# Patient Record
Sex: Male | Born: 1960 | Race: White | Hispanic: No | Marital: Single | Smoking: Never smoker
Health system: Southern US, Community
[De-identification: ages and names within clinical notes are randomized; demographics above are authoritative.]

---

## 2005-08-25 ENCOUNTER — Ambulatory Visit: Payer: Self-pay | Admitting: Pain Medicine

## 2005-09-04 ENCOUNTER — Ambulatory Visit: Payer: Self-pay | Admitting: Pain Medicine

## 2005-09-29 ENCOUNTER — Ambulatory Visit: Payer: Self-pay | Admitting: Pain Medicine

## 2005-10-09 ENCOUNTER — Ambulatory Visit: Payer: Self-pay | Admitting: Pain Medicine

## 2005-10-27 ENCOUNTER — Ambulatory Visit: Payer: Self-pay | Admitting: Physician Assistant

## 2006-03-13 ENCOUNTER — Ambulatory Visit: Payer: Self-pay | Admitting: Dermatology

## 2010-05-05 ENCOUNTER — Inpatient Hospital Stay: Payer: Self-pay | Admitting: Surgery

## 2017-02-09 ENCOUNTER — Other Ambulatory Visit: Payer: Self-pay | Admitting: Otolaryngology

## 2017-02-09 DIAGNOSIS — H9122 Sudden idiopathic hearing loss, left ear: Secondary | ICD-10-CM

## 2017-02-13 ENCOUNTER — Ambulatory Visit: Payer: No Typology Code available for payment source

## 2017-02-18 ENCOUNTER — Ambulatory Visit: Payer: No Typology Code available for payment source

## 2019-04-19 ENCOUNTER — Other Ambulatory Visit: Payer: Self-pay | Admitting: Licensed Clinical Social Worker

## 2019-04-21 ENCOUNTER — Ambulatory Visit: Payer: No Typology Code available for payment source | Admitting: Infectious Diseases

## 2019-05-17 ENCOUNTER — Other Ambulatory Visit: Payer: Self-pay

## 2019-05-17 ENCOUNTER — Encounter: Payer: Self-pay | Admitting: Infectious Diseases

## 2019-05-17 ENCOUNTER — Ambulatory Visit: Payer: BLUE CROSS/BLUE SHIELD | Attending: Infectious Diseases | Admitting: Infectious Diseases

## 2019-05-17 VITALS — BP 162/96 | HR 64 | Temp 97.6°F | Ht 75.0 in | Wt 190.1 lb

## 2019-05-17 DIAGNOSIS — Z23 Encounter for immunization: Secondary | ICD-10-CM | POA: Diagnosis not present

## 2019-05-17 DIAGNOSIS — M545 Low back pain: Secondary | ICD-10-CM | POA: Diagnosis not present

## 2019-05-17 DIAGNOSIS — I10 Essential (primary) hypertension: Secondary | ICD-10-CM | POA: Diagnosis not present

## 2019-05-17 DIAGNOSIS — H9192 Unspecified hearing loss, left ear: Secondary | ICD-10-CM

## 2019-05-17 DIAGNOSIS — E785 Hyperlipidemia, unspecified: Secondary | ICD-10-CM | POA: Diagnosis not present

## 2019-05-17 DIAGNOSIS — Z885 Allergy status to narcotic agent status: Secondary | ICD-10-CM

## 2019-05-17 DIAGNOSIS — Z21 Asymptomatic human immunodeficiency virus [HIV] infection status: Secondary | ICD-10-CM

## 2019-05-17 DIAGNOSIS — K219 Gastro-esophageal reflux disease without esophagitis: Secondary | ICD-10-CM | POA: Insufficient documentation

## 2019-05-17 DIAGNOSIS — B20 Human immunodeficiency virus [HIV] disease: Secondary | ICD-10-CM

## 2019-05-17 DIAGNOSIS — Z79899 Other long term (current) drug therapy: Secondary | ICD-10-CM

## 2019-05-17 DIAGNOSIS — G8929 Other chronic pain: Secondary | ICD-10-CM

## 2019-05-17 MED ORDER — BICTEGRAVIR-EMTRICITAB-TENOFOV 50-200-25 MG PO TABS: 1.0000 | ORAL_TABLET | Freq: Every day | ORAL | 6 refills | Status: DC

## 2019-05-17 NOTE — Patient Instructions (Signed)
You are ehre to engage in HIV care- you are on Biktarvy and you have not taken it for 6 days as you ran out of meds I called your pharmacy and prescritiopn have been sent Your BP today is 140/94 - would recommend monitoring it at [facility] ( pharmacy) and maintaining a log Today we will check the following labs- VL, Cd4, CMP, CBC, RPR, Lipid, quant god, Hepatitis panel You will get Prevnar ( Pneumococcal conjugate vaccine today) You have been on omeprazole for 2 years for GERD - it can cause osteoporosis, b12 def , risk for bacterial overgrowth- you may want to make diet modifications, do not eat late, avoid NSAID like ibuprofen and start to taper off the omeprazole. I you get symptoms of GERD you may want to see GI doctor  LBP- chronic- recommend exercise,  Stretching   Low Back Sprain or Strain Rehab Ask your health care provider which exercises are safe for you. Do exercises exactly as told by your health care provider and adjust them as directed. It is normal to feel mild stretching, pulling, tightness, or discomfort as you do these exercises. Stop right away if you feel sudden pain or your pain gets worse. Do not begin these exercises until told by your health care provider. Stretching and range-of-motion exercises These exercises warm up your muscles and joints and improve the movement and flexibility of your back. These exercises also help to relieve pain, numbness, and tingling. Lumbar rotation  1. Lie on your back on a firm surface and bend your knees. 2. Straighten your arms out to your sides so each arm forms a 90-degree angle (right angle) with a side of your body. 3. Slowly move (rotate) both of your knees to one side of your body until you feel a stretch in your lower back (lumbar). Try not to let your shoulders lift off the floor. 4. Hold this position for __________ seconds. 5. Tense your abdominal muscles and slowly move your knees back to the starting position. 6. Repeat this  exercise on the other side of your body. Repeat __________ times. Complete this exercise __________ times a day. Single knee to chest  1. Lie on your back on a firm surface with both legs straight. 2. Bend one of your knees. Use your hands to move your knee up toward your chest until you feel a gentle stretch in your lower back and buttock. ? Hold your leg in this position by holding on to the front of your knee. ? Keep your other leg as straight as possible. 3. Hold this position for __________ seconds. 4. Slowly return to the starting position. 5. Repeat with your other leg. Repeat __________ times. Complete this exercise __________ times a day. Prone extension on elbows  1. Lie on your abdomen on a firm surface (prone position). 2. Prop yourself up on your elbows. 3. Use your arms to help lift your chest up until you feel a gentle stretch in your abdomen and your lower back. ? This will place some of your body weight on your elbows. If this is uncomfortable, try stacking pillows under your chest. ? Your hips should stay down, against the surface that you are lying on. Keep your hip and back muscles relaxed. 4. Hold this position for __________ seconds. 5. Slowly relax your upper body and return to the starting position. Repeat __________ times. Complete this exercise __________ times a day. Strengthening exercises These exercises build strength and endurance in your back. Endurance is  the ability to use your muscles for a long time, even after they get tired. Pelvic tilt This exercise strengthens the muscles that lie deep in the abdomen. 1. Lie on your back on a firm surface. Bend your knees and keep your feet flat on the floor. 2. Tense your abdominal muscles. Tip your pelvis up toward the ceiling and flatten your lower back into the floor. ? To help with this exercise, you may place a small towel under your lower back and try to push your back into the towel. 3. Hold this position  for __________ seconds. 4. Let your muscles relax completely before you repeat this exercise. Repeat __________ times. Complete this exercise __________ times a day. Alternating arm and leg raises  1. Get on your hands and knees on a firm surface. If you are on a hard floor, you may want to use padding, such as an exercise mat, to cushion your knees. 2. Line up your arms and legs. Your hands should be directly below your shoulders, and your knees should be directly below your hips. 3. Lift your left leg behind you. At the same time, raise your right arm and straighten it in front of you. ? Do not lift your leg higher than your hip. ? Do not lift your arm higher than your shoulder. ? Keep your abdominal and back muscles tight. ? Keep your hips facing the ground. ? Do not arch your back. ? Keep your balance carefully, and do not hold your breath. 4. Hold this position for __________ seconds. 5. Slowly return to the starting position. 6. Repeat with your right leg and your left arm. Repeat __________ times. Complete this exercise __________ times a day. Abdominal set with straight leg raise  1. Lie on your back on a firm surface. 2. Bend one of your knees and keep your other leg straight. 3. Tense your abdominal muscles and lift your straight leg up, 4-6 inches (10-15 cm) off the ground. 4. Keep your abdominal muscles tight and hold this position for __________ seconds. ? Do not hold your breath. ? Do not arch your back. Keep it flat against the ground. 5. Keep your abdominal muscles tense as you slowly lower your leg back to the starting position. 6. Repeat with your other leg. Repeat __________ times. Complete this exercise __________ times a day. Single leg lower with bent knees 1. Lie on your back on a firm surface. 2. Tense your abdominal muscles and lift your feet off the floor, one foot at a time, so your knees and hips are bent in 90-degree angles (right angles). ? Your knees  should be over your hips and your lower legs should be parallel to the floor. 3. Keeping your abdominal muscles tense and your knee bent, slowly lower one of your legs so your toe touches the ground. 4. Lift your leg back up to return to the starting position. ? Do not hold your breath. ? Do not let your back arch. Keep your back flat against the ground. 5. Repeat with your other leg. Repeat __________ times. Complete this exercise __________ times a day. Posture and body mechanics Good posture and healthy body mechanics can help to relieve stress in your body's tissues and joints. Body mechanics refers to the movements and positions of your body while you do your daily activities. Posture is part of body mechanics. Good posture means:  Your spine is in its natural S-curve position (neutral).  Your shoulders are pulled back slightly.  Your head is not tipped forward. Follow these guidelines to improve your posture and body mechanics in your everyday activities. Standing   When standing, keep your spine neutral and your feet about hip width apart. Keep a slight bend in your knees. Your ears, shoulders, and hips should line up.  When you do a task in which you stand in one place for a long time, place one foot up on a stable object that is 2-4 inches (5-10 cm) high, such as a footstool. This helps keep your spine neutral. Sitting   When sitting, keep your spine neutral and keep your feet flat on the floor. Use a footrest, if necessary, and keep your thighs parallel to the floor. Avoid rounding your shoulders, and avoid tilting your head forward.  When working at a desk or a computer, keep your desk at a height where your hands are slightly lower than your elbows. Slide your chair under your desk so you are close enough to maintain good posture.  When working at a computer, place your monitor at a height where you are looking straight ahead and you do not have to tilt your head forward or  downward to look at the screen. Resting  When lying down and resting, avoid positions that are most painful for you.  If you have pain with activities such as sitting, bending, stooping, or squatting, lie in a position in which your body does not bend very much. For example, avoid curling up on your side with your arms and knees near your chest (fetal position).  If you have pain with activities such as standing for a long time or reaching with your arms, lie with your spine in a neutral position and bend your knees slightly. Try the following positions: ? Lying on your side with a pillow between your knees. ? Lying on your back with a pillow under your knees. Lifting   When lifting objects, keep your feet at least shoulder width apart and tighten your abdominal muscles.  Bend your knees and hips and keep your spine neutral. It is important to lift using the strength of your legs, not your back. Do not lock your knees straight out.  Always ask for help to lift heavy or awkward objects. This information is not intended to replace advice given to you by your health care provider. Make sure you discuss any questions you have with your health care provider. Document Released: 10/27/2005 Document Revised: 02/18/2019 Document Reviewed: 11/18/2018 Elsevier Patient Education  [employer].    Gastroesophageal Reflux Disease, Adult Gastroesophageal reflux (GER) happens when acid from the stomach flows up into the tube that connects the mouth and the stomach (esophagus). Normally, food travels down the esophagus and stays in the stomach to be digested. With GER, food and stomach acid sometimes move back up into the esophagus. You may have a disease called gastroesophageal reflux disease (GERD) if the reflux:  Happens often.  Causes frequent or very bad symptoms.  Causes problems such as damage to the esophagus. When this happens, the esophagus becomes sore and swollen (inflamed). Over  time, GERD can make small holes (ulcers) in the lining of the esophagus. What are the causes? This condition is caused by a problem with the muscle between the esophagus and the stomach. When this muscle is weak or not normal, it does not close properly to keep food and acid from coming back up from the stomach. The muscle can be weak because of:  Tobacco  use.  Pregnancy.  Having a certain type of hernia (hiatal hernia).  Alcohol use.  Certain foods and drinks, such as coffee, chocolate, onions, and peppermint. What increases the risk? You are more likely to develop this condition if you:  Are overweight.  Have a disease that affects your connective tissue.  Use NSAID medicines. What are the signs or symptoms? Symptoms of this condition include:  Heartburn.  Difficult or painful swallowing.  The feeling of having a lump in the throat.  A bitter taste in the mouth.  Bad breath.  Having a lot of saliva.  Having an upset or bloated stomach.  Belching.  Chest pain. Different conditions can cause chest pain. Make sure you see your doctor if you have chest pain.  Shortness of breath or noisy breathing (wheezing).  Ongoing (chronic) cough or a cough at night.  Wearing away of the surface of teeth (tooth enamel).  Weight loss. How is this treated? Treatment will depend on how bad your symptoms are. Your doctor may suggest:  Changes to your diet.  Medicine.  Surgery. Follow these instructions at home: Eating and drinking   Follow a diet as told by your doctor. You may need to avoid foods and drinks such as: ? Coffee and tea (with or without caffeine). ? Drinks that contain alcohol. ? Energy drinks and sports drinks. ? Bubbly (carbonated) drinks or sodas. ? Chocolate and cocoa. ? Peppermint and mint flavorings. ? Garlic and onions. ? Horseradish. ? Spicy and acidic foods. These include peppers, chili powder, curry powder, vinegar, hot sauces, and BBQ sauce.  ? Citrus fruit juices and citrus fruits, such as oranges, lemons, and limes. ? Tomato-based foods. These include red sauce, chili, salsa, and pizza with red sauce. ? Fried and fatty foods. These include donuts, french fries, potato chips, and high-fat dressings. ? High-fat meats. These include hot dogs, rib eye steak, sausage, ham, and bacon. ? High-fat dairy items, such as whole milk, butter, and cream cheese.  Eat small meals often. Avoid eating large meals.  Avoid drinking large amounts of liquid with your meals.  Avoid eating meals during the 2-3 hours before bedtime.  Avoid lying down right after you eat.  Do not exercise right after you eat. Lifestyle   Do not use any products that contain nicotine or tobacco. These include cigarettes, e-cigarettes, and chewing tobacco. If you need help quitting, ask your doctor.  Try to lower your stress. If you need help doing this, ask your doctor.  If you are overweight, lose an amount of weight that is healthy for you. Ask your doctor about a safe weight loss goal. General instructions  Pay attention to any changes in your symptoms.  Take over-the-counter and prescription medicines only as told by your doctor. Do not take aspirin, ibuprofen, or other NSAIDs unless your doctor says it is okay.  Wear loose clothes. Do not wear anything tight around your waist.  Raise (elevate) the head of your bed about 6 inches (15 cm).  Avoid bending over if this makes your symptoms worse.  Keep all follow-up visits as told by your doctor. This is important. Contact a doctor if:  You have new symptoms.  You lose weight and you do not know why.  You have trouble swallowing or it hurts to swallow.  You have wheezing or a cough that keeps happening.  Your symptoms do not get better with treatment.  You have a hoarse voice. Get help right away if:  You  have pain in your arms, neck, jaw, teeth, or back.  You feel sweaty, dizzy, or  light-headed.  You have chest pain or shortness of breath.  You throw up (vomit) and your throw-up looks like blood or coffee grounds.  You pass out (faint).  Your poop (stool) is bloody or black.  You cannot swallow, drink, or eat. Summary  If a person has gastroesophageal reflux disease (GERD), food and stomach acid move back up into the esophagus and cause symptoms or problems such as damage to the esophagus.  Treatment will depend on how bad your symptoms are.  Follow a diet as told by your doctor.  Take all medicines only as told by your doctor. This information is not intended to replace advice given to you by your health care provider. Make sure you discuss any questions you have with your health care provider. Document Released: 04/14/2008 Document Revised: 05/05/2018 Document Reviewed: 05/05/2018 Elsevier Patient Education  2020 [employer]

## 2019-05-17 NOTE — Progress Notes (Signed)
NAME: George RancherSteven Casey  DOB: 1961-01-24  MRN: [id number]  Date/Time: 05/17/2019 9:55 AM   Subjective:  REASON FOR CONSULT: Here to engage in HIV care- his previous provider was Dr.Fitzgerald  ? George Casey is a 58 y.o. male with a history of HIV , currently on biktarvy and has been adherent to it until 6 days ago when his 1 year refill ran out and his pharmacy asked him to get a new prescription- As his previous provider not in practice he is here to engage  in care. 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 Oxycodone Rash   ? Current Outpatient Medications  Medication Sig Dispense Refill  . albuterol (VENTOLIN HFA) 108 (90 Base) MCG/ACT inhaler Inhale 2 puffs into the lungs every 6 (six) hours.    Marland Kitchen. atorvastatin (LIPITOR) 20 MG tablet Take 1 tablet by mouth daily.    . bictegravir-emtricitabine-tenofovir AF (BIKTARVY) 50-200-25 MG TABS tablet Take 1 tablet by mouth daily.    Marland Kitchen. desoximetasone (TOPICORT) 0.25 % cream Apply 1 application topically 2 (two) times a day.    Marland Kitchen. omeprazole (PRILOSEC) 40 MG capsule  Take 1 capsule by mouth 2 (two) times a day.     No current facility-administered medications for this visit.     REVIEW OF SYSTEMS:  Const: negative fever, negative chills, negative weight loss Eyes: negative diplopia or visual changes, negative eye pain ENT: negative coryza, negative sore throat Resp: negative cough, hemoptysis, dyspnea Cards: negative for chest pain, palpitations, lower extremity edema GU: negative for frequency, dysuria and hematuria GI- loose stool - once or twice a week Skin: negative for rash and pruritus Heme: negative for easy bruising and gum/nose bleeding MS: back pain- chronic Neurolo:+ headaches, dizziness, vertigo, memory problems  Psych: negative for feelings of anxiety, depression   Objective:  VITALS:  BP (!) 162/96 (BP Location: Right Arm, Patient Position: Sitting, Cuff Size: Normal)   Pulse 64   Temp 97.6 F (36.4 C) (Oral)   Ht 6\' 3"  (1.905 m)   Wt 190 lb 2 oz (86.2 kg)   BMI 23.76 kg/m  PHYSICAL EXAM: repeat BP 140/94 General: Alert, cooperative, no distress, appears stated age.  Head: Normocephalic, without obvious abnormality, atraumatic. Eyes: Conjunctivae clear, anicteric sclerae. Pupils are equal Left hearing aid Nose: Nares normal. No drainage or sinus tenderness. Throat: Lips, mucosa, and tongue normal. No Thrush Dentition poor Neck: Supple, symmetrical, no adenopathy, thyroid: non tender no carotid bruit and no JVD. Back: No CVA tenderness. Lungs: Clear to auscultation bilaterally. No Wheezing or Rhonchi. No rales. Heart:  Regular rate and rhythm, no murmur, rub or gallop. Abdomen: Soft, non-tender,not distended. Bowel sounds normal. No masses Extremities: Extremities normal, atraumatic, no cyanosis. No edema. No clubbing Skin: No rashes or lesions. Not Jaundiced Lymph: Cervical, supraclavicular normal. Neurologic: Grossly non-focal Pertinent Labs  Health maintenance Vaccination  Vaccine Date last given comment   Influenza    Hepatitis B    Hepatitis A    Prevnar-PCV-13 05/17/19   Pneumovac-PPSV-23 08/08/10   TdaP 08/26/12   HPV    Shingrix ( zoster vaccine)     ______________________  Labs Lab Result  Date comment  HIV VL <20    CD4 562 (21.6%) 04/19/18   Genotype     [id number]     HIV antibody     RPR     Quantiferon Gold     Hep C ab     Hepatitis B-ab,ag,c     Hepatitis A-IgM, IgG /T     Lipid 259/120/65/ 170 04/16/18 On atorvasttain  GC/CHL     PAP     HB,PLT,Cr, LFT       Preventive  Procedure Result  Date comment  colonoscopy     Mammogram     Dental exam 1 year ago    Opthal       Impression/Recommendation 58 yr male with h/o HIV, hyperlipidemia  HIV; well controlled on HAARt- combination of integrase inhibitor+ FTC+ TAF = Biktarvy?- last Vl < 20 and cd4 > 500 in June 2019. Pt has been off [place] for 6 days as he could not get a refill .   Hyperlipidemia on atorvastatin  High BP- says he never had before- rechecked and it was 140/94- asked him to monitor his BP and if high to report to me.  GERD- has been on PPI for 2 years- Discussed about possible adverse effects of PPI including osteoporosis, B 12 def, overgrowth of bacteria in the intestine and renal issues Asked him to taper it off-  Also asked him to avoid NSAID  Chronic low back pain- advised strengthening exercises and stretches. H/oleft sciatica which has resolved  Health maintenance updated ANAL Pap needed ( GC/CHL)  Will get labs today  Prevnar ( PCV 13) given today  Follow up with BP mnitoring ? Follow up 6 months for HIV  ___________________________________________________ Discussed the management with the patient

## 2019-06-08 ENCOUNTER — Inpatient Hospital Stay: Admit: 2019-06-08 | Payer: BLUE CROSS/BLUE SHIELD

## 2019-06-08 ENCOUNTER — Other Ambulatory Visit: Payer: Self-pay

## 2019-06-10 ENCOUNTER — Other Ambulatory Visit: Payer: Self-pay | Admitting: Infectious Diseases

## 2019-06-17 LAB — T-HELPER CELLS CD4/CD8 %
% CD 4 Pos. Lymph.: 25.6 % — ABNORMAL LOW (ref 30.8–58.5)
Absolute CD 4 Helper: 512 /uL (ref 359–1519)
Basophils Absolute: 0.1 10*3/uL (ref 0.0–0.2)
Basos: 2 %
CD3+CD4+ Cells/CD3+CD8+ Cells Bld: 0.45 — ABNORMAL LOW (ref 0.92–3.72)
CD3+CD8+ Cells # Bld: 1134 /uL — ABNORMAL HIGH (ref 109–897)
CD3+CD8+ Cells NFr Bld: 56.7 % — ABNORMAL HIGH (ref 12.0–35.5)
EOS (ABSOLUTE): 0.2 10*3/uL (ref 0.0–0.4)
Eos: 3 %
Hematocrit: 41.7 % (ref 37.5–51.0)
Hemoglobin: 15.2 g/dL (ref 13.0–17.7)
Immature Grans (Abs): 0 10*3/uL (ref 0.0–0.1)
Immature Granulocytes: 0 %
Lymphocytes Absolute: 2 10*3/uL (ref 0.7–3.1)
Lymphs: 31 %
MCH: 33.9 pg — ABNORMAL HIGH (ref 26.6–33.0)
MCHC: 36.5 g/dL — ABNORMAL HIGH (ref 31.5–35.7)
MCV: 93 fL (ref 79–97)
Monocytes Absolute: 0.6 10*3/uL (ref 0.1–0.9)
Monocytes: 10 %
Neutrophils Absolute: 3.5 10*3/uL (ref 1.4–7.0)
Neutrophils: 54 %
Platelets: 180 10*3/uL (ref 150–450)
RBC: 4.49 x10E6/uL (ref 4.14–5.80)
RDW: 12.5 % (ref 11.6–15.4)
WBC: 6.5 10*3/uL (ref 3.4–10.8)

## 2019-06-17 LAB — COMPREHENSIVE METABOLIC PANEL
ALT: 24 IU/L (ref 0–44)
AST: 20 IU/L (ref 0–40)
Albumin/Globulin Ratio: 2.3 — ABNORMAL HIGH (ref 1.2–2.2)
Albumin: 4.8 g/dL (ref 3.8–4.9)
Alkaline Phosphatase: 66 IU/L (ref 39–117)
BUN/Creatinine Ratio: 16 (ref 9–20)
BUN: 15 mg/dL (ref 6–24)
Bilirubin Total: 0.9 mg/dL (ref 0.0–1.2)
CO2: 26 mmol/L (ref 20–29)
Calcium: 9.5 mg/dL (ref 8.7–10.2)
Chloride: 100 mmol/L (ref 96–106)
Creatinine, Ser: 0.94 mg/dL (ref 0.76–1.27)
GFR calc Af Amer: 103 mL/min/{1.73_m2} (ref >59)
GFR calc non Af Amer: 89 mL/min/{1.73_m2} (ref >59)
Globulin, Total: 2.1 g/dL (ref 1.5–4.5)
Glucose: 90 mg/dL (ref 65–99)
Potassium: 4.3 mmol/L (ref 3.5–5.2)
Sodium: 142 mmol/L (ref 134–144)
Total Protein: 6.9 g/dL (ref 6.0–8.5)

## 2019-06-17 LAB — LIPID PANEL
Chol/HDL Ratio: 3.9 ratio (ref 0.0–5.0)
Cholesterol, Total: 236 mg/dL — ABNORMAL HIGH (ref 100–199)
HDL: 61 mg/dL (ref >39)
LDL Calculated: 139 mg/dL — ABNORMAL HIGH (ref 0–99)
Triglycerides: 180 mg/dL — ABNORMAL HIGH (ref 0–149)
VLDL Cholesterol Cal: 36 mg/dL (ref 5–40)

## 2019-06-17 LAB — HIV-1 RNA QUANT-NO REFLEX-BLD: HIV-1 RNA Viral Load: <20 copies/mL

## 2019-06-17 LAB — GC/CHLAMYDIA PROBE AMP
Chlamydia trachomatis, NAA: NEGATIVE
Neisseria Gonorrhoeae by PCR: NEGATIVE

## 2019-06-17 LAB — QUANTIFERON-TB GOLD PLUS
QuantiFERON Mitogen Value: 8.5 IU/mL
QuantiFERON Nil Value: 0.01 IU/mL
QuantiFERON TB1 Ag Value: 0.02 IU/mL
QuantiFERON TB2 Ag Value: 0.02 IU/mL
QuantiFERON-TB Gold Plus: NEGATIVE

## 2019-06-17 LAB — HEPATITIS PANEL, ACUTE
Hep A IgM: NEGATIVE
Hep B C IgM: NEGATIVE
Hep C Virus Ab: <0.1 s/co ratio (ref 0.0–0.9)
Hepatitis B Surface Ag: NEGATIVE

## 2019-06-17 LAB — HEMOGLOBIN A1C
Est. average glucose Bld gHb Est-mCnc: 103 mg/dL
Hgb A1c MFr Bld: 5.2 % (ref 4.8–5.6)

## 2019-06-17 LAB — HEPATITIS B SURFACE ANTIBODY, QUANTITATIVE: Hepatitis B Surf Ab Quant: <3.1 m[IU]/mL — ABNORMAL LOW (ref >9.9)

## 2019-06-17 LAB — RPR: RPR Ser Ql: NONREACTIVE

## 2019-09-13 ENCOUNTER — Other Ambulatory Visit: Payer: Self-pay | Admitting: *Deleted

## 2019-09-13 DIAGNOSIS — Z20822 Contact with and (suspected) exposure to covid-19: Secondary | ICD-10-CM

## 2019-09-14 LAB — NOVEL CORONAVIRUS, NAA: SARS-CoV-2, NAA: NOT DETECTED

## 2019-11-17 ENCOUNTER — Ambulatory Visit: Payer: BLUE CROSS/BLUE SHIELD | Admitting: Infectious Diseases

## 2019-11-24 ENCOUNTER — Telehealth: Payer: Self-pay

## 2019-11-24 NOTE — Telephone Encounter (Signed)
Left message regarding Prior Auth and next appt.

## 2019-12-27 ENCOUNTER — Other Ambulatory Visit: Payer: Self-pay | Admitting: Infectious Diseases

## 2019-12-27 MED ORDER — BICTEGRAVIR-EMTRICITAB-TENOFOV 50-200-25 MG PO TABS: 1.0000 | ORAL_TABLET | Freq: Every day | ORAL | 6 refills | Status: AC

## 2020-03-23 LAB — EXTERNAL GENERIC LAB PROCEDURE: COLOGUARD: NEGATIVE

## 2020-03-23 LAB — COLOGUARD: COLOGUARD: NEGATIVE

## 2021-06-04 ENCOUNTER — Other Ambulatory Visit: Payer: Self-pay | Admitting: Orthopedic Surgery

## 2021-06-04 ENCOUNTER — Other Ambulatory Visit (HOSPITAL_COMMUNITY): Payer: Self-pay | Admitting: Orthopedic Surgery

## 2021-06-04 DIAGNOSIS — M19011 Primary osteoarthritis, right shoulder: Secondary | ICD-10-CM

## 2021-06-04 DIAGNOSIS — M75101 Unspecified rotator cuff tear or rupture of right shoulder, not specified as traumatic: Secondary | ICD-10-CM

## 2021-06-07 ENCOUNTER — Ambulatory Visit: Admission: RE | Admit: 2021-06-07 | Payer: 59 | Source: Ambulatory Visit

## 2021-06-14 ENCOUNTER — Ambulatory Visit: Admission: RE | Admit: 2021-06-14 | Payer: 59 | Source: Ambulatory Visit

## 2021-06-27 ENCOUNTER — Other Ambulatory Visit: Payer: Self-pay | Admitting: Infectious Diseases

## 2021-06-27 DIAGNOSIS — M545 Low back pain, unspecified: Secondary | ICD-10-CM

## 2021-07-06 ENCOUNTER — Ambulatory Visit: Payer: 59

## 2021-07-20 ENCOUNTER — Ambulatory Visit
Admission: RE | Admit: 2021-07-20 | Discharge: 2021-07-20 | Disposition: A | Discharge status: Home / Self Care | Payer: 59 | Source: Ambulatory Visit | Attending: Orthopedic Surgery | Admitting: Orthopedic Surgery

## 2021-07-20 ENCOUNTER — Ambulatory Visit
Admission: RE | Admit: 2021-07-20 | Discharge: 2021-07-20 | Disposition: A | Discharge status: Home / Self Care | Payer: 59 | Source: Ambulatory Visit | Attending: Infectious Diseases | Admitting: Infectious Diseases

## 2021-07-20 ENCOUNTER — Other Ambulatory Visit: Payer: Self-pay

## 2021-07-20 DIAGNOSIS — M75101 Unspecified rotator cuff tear or rupture of right shoulder, not specified as traumatic: Secondary | ICD-10-CM | POA: Insufficient documentation

## 2021-07-20 DIAGNOSIS — M19011 Primary osteoarthritis, right shoulder: Secondary | ICD-10-CM

## 2021-07-20 DIAGNOSIS — M545 Low back pain, unspecified: Secondary | ICD-10-CM | POA: Diagnosis not present

## 2021-07-20 DIAGNOSIS — G8929 Other chronic pain: Secondary | ICD-10-CM | POA: Insufficient documentation

## 2021-07-20 MED ORDER — GADOBUTROL 1 MMOL/ML IV SOLN
8.0000 mL | Freq: Once | INTRAVENOUS | Status: AC | PRN
  Administered 2021-07-20: 10 mL via INTRAVENOUS

## 2021-10-20 ENCOUNTER — Emergency Department
Admission: EM | Admit: 2021-10-20 | Discharge: 2021-10-20 | Disposition: A | Discharge status: Home / Self Care | Payer: 59 | Attending: Emergency Medicine | Admitting: Emergency Medicine

## 2021-10-20 ENCOUNTER — Other Ambulatory Visit: Payer: Self-pay

## 2021-10-20 ENCOUNTER — Encounter: Payer: Self-pay | Admitting: Emergency Medicine

## 2021-10-20 DIAGNOSIS — Z21 Asymptomatic human immunodeficiency virus [HIV] infection status: Secondary | ICD-10-CM | POA: Diagnosis not present

## 2021-10-20 DIAGNOSIS — K0889 Other specified disorders of teeth and supporting structures: Secondary | ICD-10-CM | POA: Insufficient documentation

## 2021-10-20 MED ORDER — AMOXICILLIN 875 MG PO TABS: 875.0000 mg | ORAL_TABLET | Freq: Two times a day (BID) | ORAL | 0 refills | Status: AC

## 2021-10-20 MED ORDER — LIDOCAINE VISCOUS HCL 2 % MT SOLN
15.0000 mL | Freq: Once | OROMUCOSAL | Status: AC
  Administered 2021-10-20: 15 mL via OROMUCOSAL

## 2021-10-20 MED ORDER — AMOXICILLIN 875 MG PO TABS: 875.0000 mg | ORAL_TABLET | Freq: Two times a day (BID) | ORAL | 0 refills | Status: DC

## 2021-10-20 MED ORDER — ACETAMINOPHEN-CODEINE 300-30 MG PO TABS: 1.0000 | ORAL_TABLET | Freq: Four times a day (QID) | ORAL | 0 refills | Status: AC | PRN

## 2021-10-20 MED ORDER — ACETAMINOPHEN-CODEINE 300-30 MG PO TABS: 1.0000 | ORAL_TABLET | Freq: Four times a day (QID) | ORAL | 0 refills | Status: DC | PRN

## 2021-10-20 NOTE — ED Triage Notes (Signed)
Pt reports toothache to right lower jaw back tooth for several days. Denies trauma, states thinks it may be infected.

## 2021-10-20 NOTE — ED Provider Notes (Signed)
[facility] Emergency Department Provider Note  ____________________________________________   Event Date/Time   First MD Initiated Contact with Patient 10/20/21 [phone]     (approximate)  I have reviewed the triage vital signs and the nursing notes.   HISTORY  Chief Complaint Dental Pain   HPI George Casey is a 60 y.o. male presents to the ED with complaint of right lower dental pain for the last 4 days.  Patient is unaware of any injury to his tooth.  He states that the pain has kept him up for the last 2 nights.  Currently he does have a local dentist and plans on calling them tomorrow.  He rates pain as a 10/10.       History reviewed. No pertinent past medical history.  Patient Active Problem List   Diagnosis Date Noted   HIV disease (HCC) 05/17/2019   Hyperlipidemia 05/17/2019   GERD (gastroesophageal reflux disease) 05/17/2019    History reviewed. No pertinent surgical history.  Prior to Admission medications   Medication Sig Start Date End Date Taking? Authorizing Provider  Acetaminophen-Codeine (TYLENOL/CODEINE #3) 300-30 MG tablet Take 1 tablet by mouth every 6 (six) hours as needed for pain. 10/20/21  Yes Tommi Rumps, PA-C  amoxicillin (AMOXIL) 875 MG tablet Take 1 tablet (875 mg total) by mouth 2 (two) times daily. 10/20/21  Yes Bridget Hartshorn L, PA-C  albuterol (VENTOLIN HFA) 108 (90 Base) MCG/ACT inhaler Inhale 2 puffs into the lungs every 6 (six) hours. 11/13/16   [provider]  atorvastatin (LIPITOR) 20 MG tablet Take 1 tablet by mouth daily. 04/26/18   [provider]  bictegravir-emtricitabine-tenofovir AF (BIKTARVY) 50-200-25 MG TABS tablet Take 1 tablet by mouth daily. 12/27/19   Lynn Ito, MD  desoximetasone (TOPICORT) 0.25 % cream Apply 1 application topically 2 (two) times a day. 11/13/16   [provider]  omeprazole (PRILOSEC) 40 MG capsule Take 1 capsule by mouth 2 (two) times a day.  07/21/18   [provider]    Allergies Oxycodone  History reviewed. No pertinent family history.  Social History Social History   Tobacco Use   Smoking status: Never   Smokeless tobacco: Never  Substance Use Topics   Alcohol use: Yes    Alcohol/week: 2.0 standard drinks    Types: 1 Cans of beer, 1 Glasses of wine per week   Drug use: Never    Review of Systems Constitutional: No fever/chills Eyes: No visual changes. ENT: No sore throat.  Positive for dental pain. Cardiovascular: Denies chest pain. Respiratory: Denies shortness of breath. Musculoskeletal: Negative for muscle aches. Skin: Negative for rash. Neurological: Negative for focal weakness or numbness. Immunological:  + HIV  ____________________________________________   PHYSICAL EXAM:  VITAL SIGNS: ED Triage Vitals  Enc Vitals Group     BP 10/20/21 0746 (!) 172/97     Pulse Rate 10/20/21 0746 73     Resp 10/20/21 0746 18     Temp 10/20/21 0746 97.9 F (36.6 C)     Temp Source 10/20/21 0746 Oral     SpO2 10/20/21 0746 96 %     Weight 10/20/21 0722 185 lb (83.9 kg)     Height 10/20/21 0722 6\' 3"  (1.905 m)     Head Circumference --      Peak Flow --      Pain Score 10/20/21 0722 10     Pain Loc --      Pain Edu? --  Excl. in GC? --     Constitutional: Alert and oriented. Well appearing and in no acute distress. Eyes: Conjunctivae are normal.  Head: Atraumatic. Nose: No congestion/rhinnorhea. Mouth/Throat: Mucous membranes are moist.  Oropharynx non-erythematous.  Right lower posterior molarWith extremely tender gums to light palpation.  No obvious exudate or drainage is present. Neck: No stridor.   Hematological/Lymphatic/Immunilogical: No cervical lymphadenopathy. Cardiovascular: Normal rate, regular rhythm. Grossly normal heart sounds.  Good peripheral circulation. Respiratory: Normal respiratory effort.  No retractions. Lungs CTAB. Musculoskeletal: Moves upper and lower  extremities without any difficulty.  Normal gait was noted. Neurologic:  Normal speech and language. No gross focal neurologic deficits are appreciated. No gait instability. Skin:  Skin is warm, dry and intact.  Psychiatric: Mood and affect are normal. Speech and behavior are normal.  ____________________________________________   LABS (all labs ordered are listed, but only abnormal results are displayed)  Labs Reviewed - No data to display ____________________________________________   PROCEDURES  Procedure(s) performed (including Critical Care):  Procedures   ____________________________________________   INITIAL IMPRESSION / ASSESSMENT AND PLAN / ED COURSE  As part of my medical decision making, I reviewed the following data within the electronic MEDICAL RECORD NUMBER Notes from prior ED visits and  Controlled Substance Database  60 year old male presents to the ED with complaint of 4 days dental pain.Patient has taken over-the-counter medication without any relief.  He is established with a local dentist and plans to call tomorrow.A prescription for amoxicillin 875 twice daily was sent to his pharmacy along with acetaminophen-codeine 1 tablet every 6 hours as needed pain #8 no refill. ____________________________________________   FINAL CLINICAL IMPRESSION(S) / ED DIAGNOSES  Final diagnoses:  Pain, dental     ED Discharge Orders          Ordered    amoxicillin (AMOXIL) 875 MG tablet  2 times daily        10/20/21 0821    Acetaminophen-Codeine (TYLENOL/CODEINE #3) 300-30 MG tablet  Every 6 hours PRN        10/20/21 2202             Note:  This document was prepared using Dragon voice recognition software and may include unintentional dictation errors.    Tommi Rumps, PA-C 10/20/21 5427    Arnaldo Natal, MD 10/20/21 [phone]

## 2021-10-20 NOTE — Discharge Instructions (Addendum)
Call your dentist tomorrow and begin taking antibiotics today.  Amoxicillin 875 twice daily for the next 10 days.  The Tylenol with codeine is a narcotic and should not be taken while driving or operating machinery.  Soft foods and chew mostly on the opposite side.

## 2021-10-20 NOTE — ED Notes (Signed)
Reviewed discharge instructions, follow-up care, and prescriptions with patient. Patient verbalized understanding of all information reviewed. Patient stable, with no distress noted at this time.    

## 2022-01-27 ENCOUNTER — Other Ambulatory Visit (HOSPITAL_COMMUNITY): Payer: Self-pay

## 2022-01-27 ENCOUNTER — Telehealth: Payer: Self-pay

## 2022-01-27 DIAGNOSIS — Z21 Asymptomatic human immunodeficiency virus [HIV] infection status: Secondary | ICD-10-CM | POA: Diagnosis not present

## 2022-01-27 DIAGNOSIS — L0390 Cellulitis, unspecified: Secondary | ICD-10-CM | POA: Diagnosis not present

## 2022-01-27 DIAGNOSIS — R600 Localized edema: Secondary | ICD-10-CM | POA: Diagnosis not present

## 2022-01-27 DIAGNOSIS — E7800 Pure hypercholesterolemia, unspecified: Secondary | ICD-10-CM | POA: Diagnosis not present

## 2022-01-27 NOTE — Telephone Encounter (Signed)
[facility] Patient Advocate Encounter ?  ?I was successful in securing patient a $7500.00 grant from [employer] ([employer]) to provide copayment coverage for [employer].  This will make the out of pocket cost $0.00.   ?  ?I have spoken with the patient.   ? ?The billing information is as follows and has been shared with [facility].  ? ? ? ? ? ?Dates of Eligibility: 01/27/22 through 01/28/23 ? ?Patient knows to call the office with questions or concerns. ? ?Clearance Coots, CPhT ?Specialty Pharmacy Patient Advocate ?[facility] ?Phone: [phone] ?Fax:  [phone]  ?

## 2022-07-03 DIAGNOSIS — E7800 Pure hypercholesterolemia, unspecified: Secondary | ICD-10-CM | POA: Diagnosis not present

## 2022-07-03 DIAGNOSIS — L0390 Cellulitis, unspecified: Secondary | ICD-10-CM | POA: Diagnosis not present

## 2022-07-03 DIAGNOSIS — R600 Localized edema: Secondary | ICD-10-CM | POA: Diagnosis not present

## 2022-07-03 DIAGNOSIS — Z113 Encounter for screening for infections with a predominantly sexual mode of transmission: Secondary | ICD-10-CM | POA: Diagnosis not present

## 2022-07-03 DIAGNOSIS — Z21 Asymptomatic human immunodeficiency virus [HIV] infection status: Secondary | ICD-10-CM | POA: Diagnosis not present

## 2023-04-01 DIAGNOSIS — I10 Essential (primary) hypertension: Secondary | ICD-10-CM | POA: Diagnosis not present

## 2023-04-01 DIAGNOSIS — Z0000 Encounter for general adult medical examination without abnormal findings: Secondary | ICD-10-CM | POA: Diagnosis not present

## 2023-04-01 DIAGNOSIS — Z21 Asymptomatic human immunodeficiency virus [HIV] infection status: Secondary | ICD-10-CM | POA: Diagnosis not present

## 2023-04-01 DIAGNOSIS — E7800 Pure hypercholesterolemia, unspecified: Secondary | ICD-10-CM | POA: Diagnosis not present

## 2023-04-01 DIAGNOSIS — N529 Male erectile dysfunction, unspecified: Secondary | ICD-10-CM | POA: Diagnosis not present

## 2023-04-01 DIAGNOSIS — K219 Gastro-esophageal reflux disease without esophagitis: Secondary | ICD-10-CM | POA: Diagnosis not present

## 2023-04-01 DIAGNOSIS — Z125 Encounter for screening for malignant neoplasm of prostate: Secondary | ICD-10-CM | POA: Diagnosis not present

## 2023-04-01 DIAGNOSIS — R600 Localized edema: Secondary | ICD-10-CM | POA: Diagnosis not present

## 2023-06-04 DIAGNOSIS — Z1212 Encounter for screening for malignant neoplasm of rectum: Secondary | ICD-10-CM | POA: Diagnosis not present

## 2023-06-04 DIAGNOSIS — Z1211 Encounter for screening for malignant neoplasm of colon: Secondary | ICD-10-CM | POA: Diagnosis not present

## 2023-06-09 LAB — COLOGUARD: COLOGUARD: NEGATIVE

## 2023-06-09 LAB — EXTERNAL GENERIC LAB PROCEDURE: COLOGUARD: NEGATIVE

## 2023-09-02 DIAGNOSIS — N529 Male erectile dysfunction, unspecified: Secondary | ICD-10-CM | POA: Diagnosis not present

## 2023-09-02 DIAGNOSIS — Z21 Asymptomatic human immunodeficiency virus [HIV] infection status: Secondary | ICD-10-CM | POA: Diagnosis not present

## 2023-09-02 DIAGNOSIS — J45909 Unspecified asthma, uncomplicated: Secondary | ICD-10-CM | POA: Diagnosis not present

## 2023-09-02 DIAGNOSIS — Z809 Family history of malignant neoplasm, unspecified: Secondary | ICD-10-CM | POA: Diagnosis not present

## 2023-09-02 DIAGNOSIS — Z79899 Other long term (current) drug therapy: Secondary | ICD-10-CM | POA: Diagnosis not present

## 2023-09-02 DIAGNOSIS — I10 Essential (primary) hypertension: Secondary | ICD-10-CM | POA: Diagnosis not present

## 2023-09-02 DIAGNOSIS — E785 Hyperlipidemia, unspecified: Secondary | ICD-10-CM | POA: Diagnosis not present

## 2023-09-02 DIAGNOSIS — K219 Gastro-esophageal reflux disease without esophagitis: Secondary | ICD-10-CM | POA: Diagnosis not present

## 2023-09-02 DIAGNOSIS — Z8249 Family history of ischemic heart disease and other diseases of the circulatory system: Secondary | ICD-10-CM | POA: Diagnosis not present

## 2023-09-02 DIAGNOSIS — Z823 Family history of stroke: Secondary | ICD-10-CM | POA: Diagnosis not present

## 2023-09-10 IMAGING — MR MR SHOULDER*R* W/O CM
4 of 5 series · 30 of 40 positions shown · non-contrast
Comparison: Report only from outside radiographs 03/13/2021

CLINICAL DATA: Right shoulder pain and limited range of motion.
History of weight lifting. Reported surgery in 1882.

EXAM:
MRI OF THE RIGHT SHOULDER WITHOUT CONTRAST
TECHNIQUE: Multiplanar, multisequence MR imaging of the shoulder was performed.
No intravenous contrast was administered.

[Series 5: T2 fat-sat · axial · right · 4.0mm · 0.44mm/px · z∈[-15,+119]mm · 8 of 30 slices shown (1 of 3)]
[im 1/30]
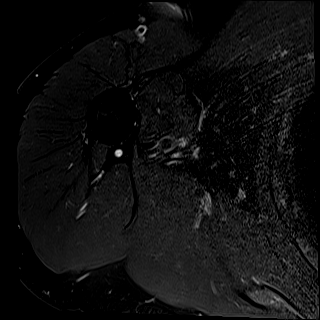
[im 4/30]
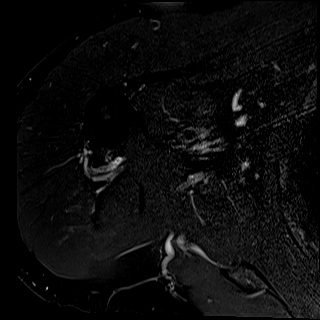
[im 10/30]
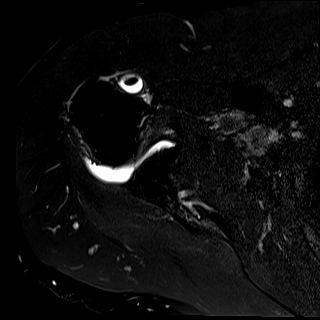
[im 13/30]
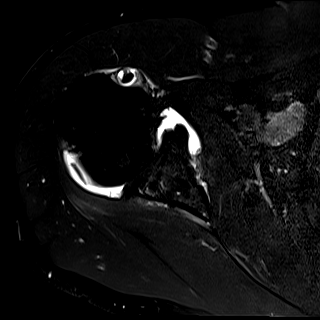
[im 17/30]
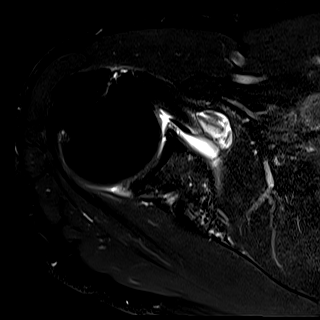
[im 20/30]
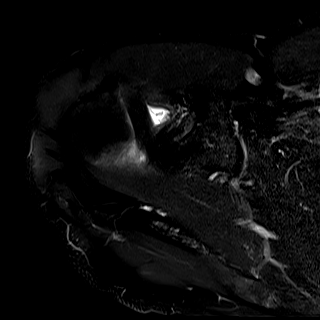
[im 26/30]
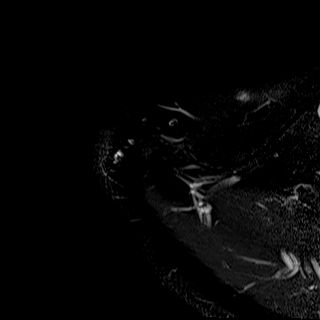
[im 30/30]
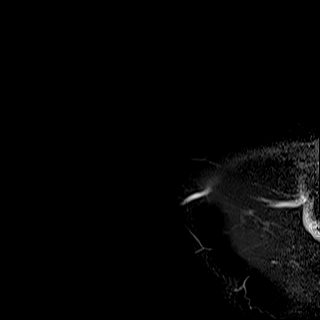

[Series 6: PD · oblique · right · 4.0mm · 0.44mm/px · 8 of 26 slices shown]
[im 1/26]
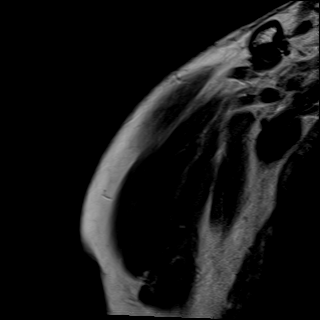
[im 4/26]
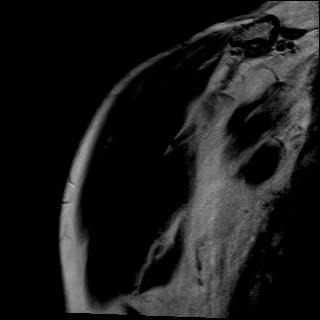
[im 8/26]
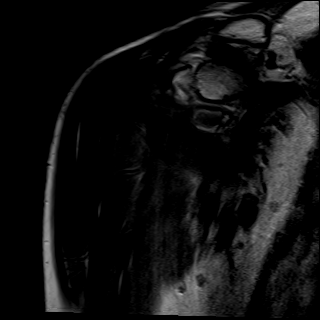
[im 11/26]
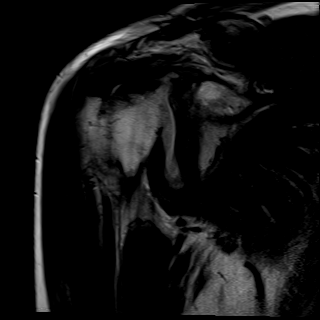
[im 15/26]
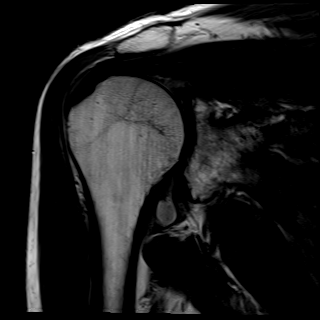
[im 18/26]
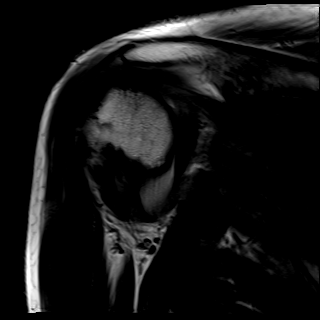
[im 22/26]
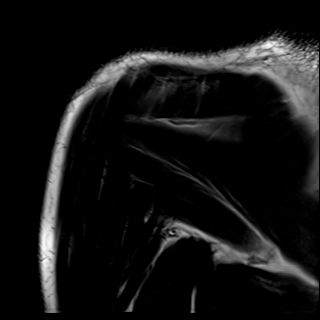
[im 26/26]
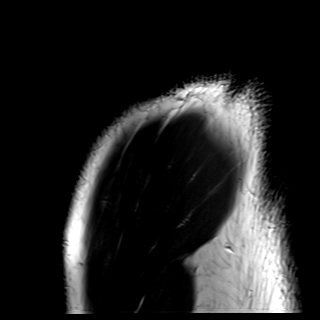

[Series 7: T2 fat-sat · oblique · right · 4.0mm · 0.44mm/px · 8 of 26 slices shown (2 of 3)]
[im 1/26]
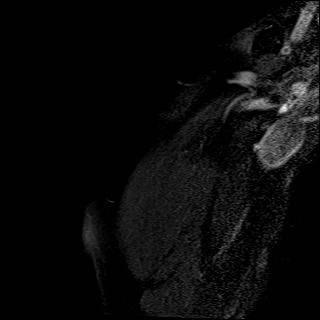
[im 4/26]
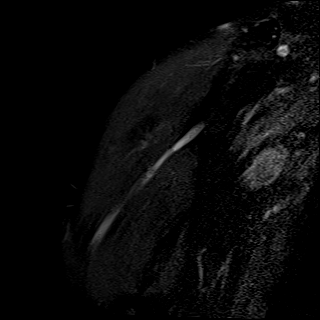
[im 8/26]
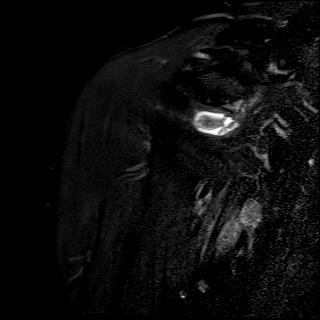
[im 11/26]
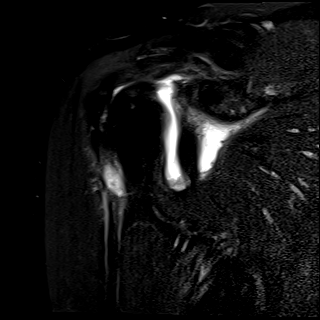
[im 15/26]
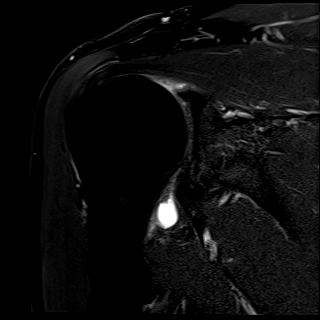
[im 18/26]
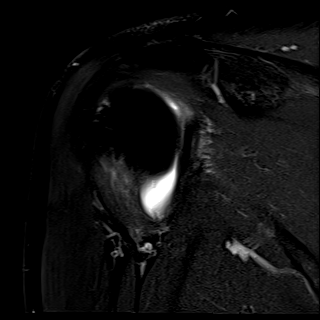
[im 22/26]
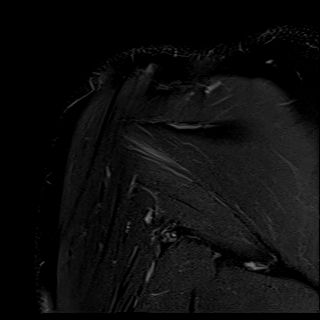
[im 26/26]
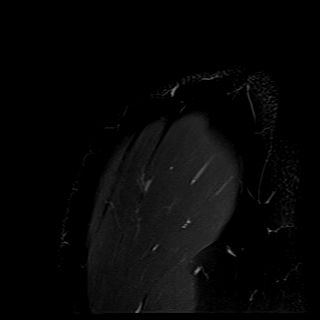

[Series 8: T2 fat-sat · oblique · right · 4.0mm · 0.22mm/px · 6 of 22 slices shown (3 of 3)]
[im 1/22]
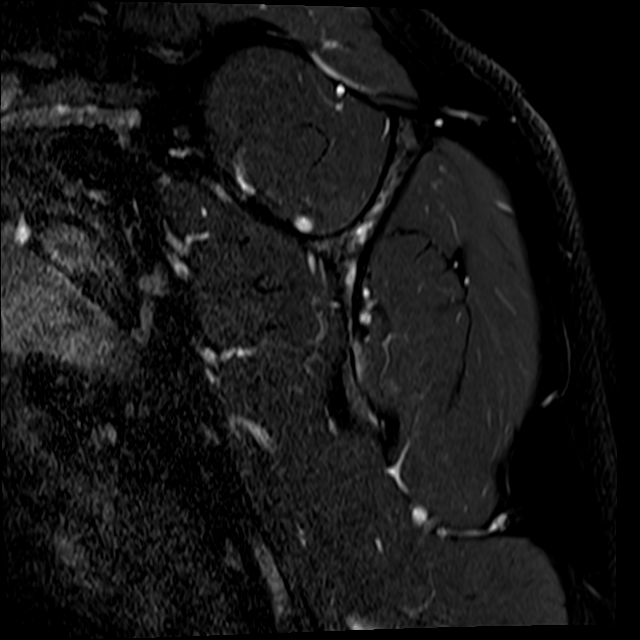
[im 4/22]
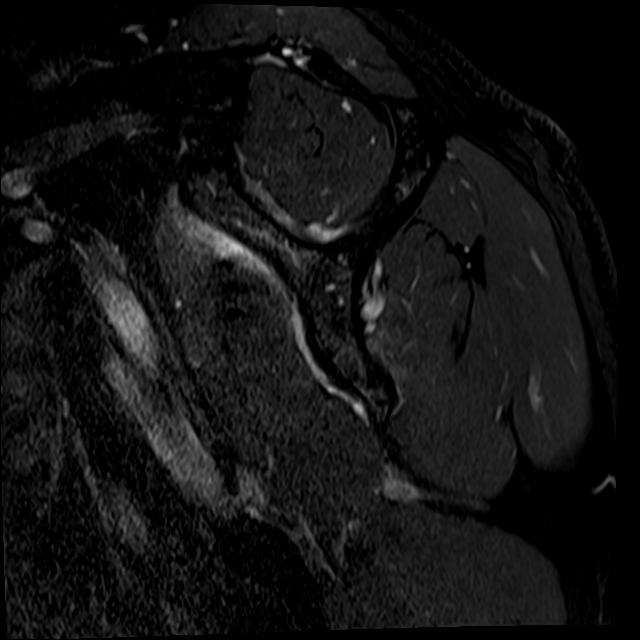
[im 8/22]
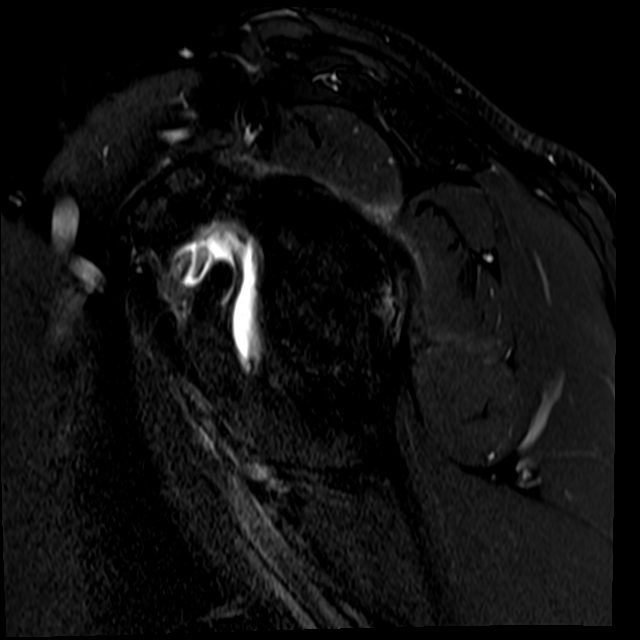
[im 11/22]
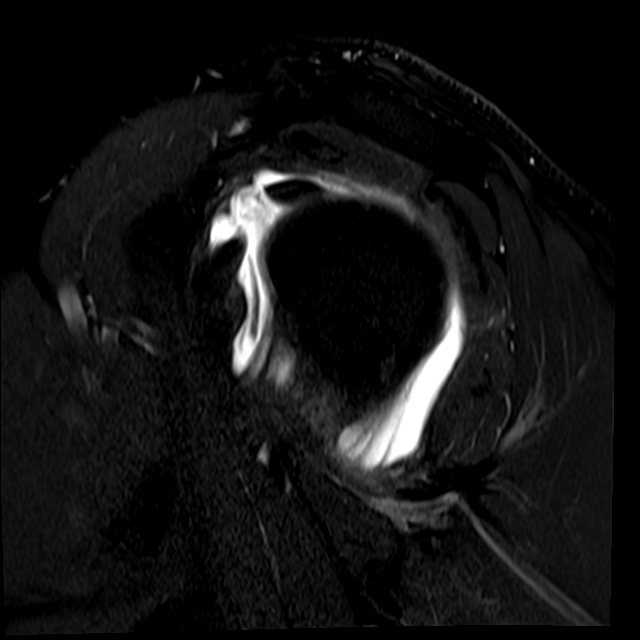
[im 15/22]
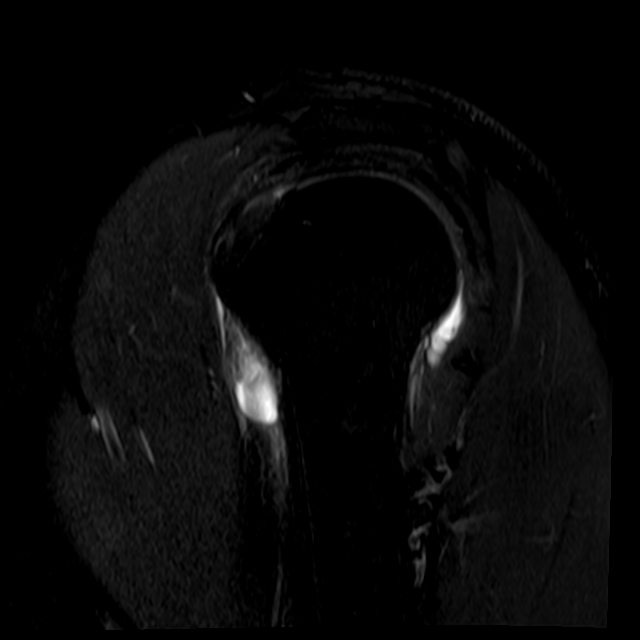
[im 18/22]
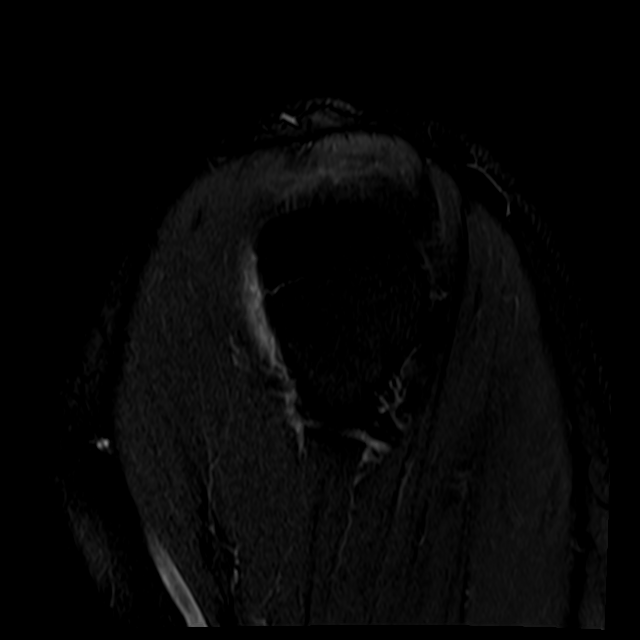

[30 of 40 positions shown; findings below may reference images not displayed]

FINDINGS: Rotator cuff:  Intact without significant tendinosis.

Muscles:  No focal muscular atrophy or edema.

Biceps long head:  Intact and normally positioned.

Acromioclavicular Joint: Evidence of previous distal clavicle
resection. No significant fluid in the subacromial-subdeltoid space.

Glenohumeral Joint: Moderate glenohumeral degenerative changes with
prominent subchondral cyst formation posteriorly in the glenoid.
Moderate size shoulder joint effusion with synovial thickening in
the superior subscapularis recess.

Labrum: Posterior labral degeneration without discrete tear or
paralabral cyst.

Bones: No acute or significant extra-articular osseous findings.Mild
subcortical cyst formation posteriorly in the humeral head near the
infraspinatus insertion.

Other: No significant soft tissue findings.
IMPRESSION: 1. Postsurgical changes consistent with previous distal clavicle
resection.
2. The rotator cuff appears intact without significant tendinosis.
The biceps tendon appears intact.
3. Moderate glenohumeral degenerative changes with posterior labral
degeneration.
4. No acute osseous findings evident.

## 2024-01-11 ENCOUNTER — Telehealth: Payer: Self-pay

## 2024-01-11 ENCOUNTER — Other Ambulatory Visit (HOSPITAL_COMMUNITY): Payer: Self-pay

## 2024-01-11 NOTE — Telephone Encounter (Signed)
 [facility] Patient Advocate Encounter   Was successful in obtaining a [place] copay card for [employer].  This copay card will make the patients copay 0.00.  I have spoken with the patient.    The billing information is as follows and has been shared with [facility].  #[phone]         Clearance Coots, CPhT Specialty Pharmacy Patient Advocate [facility] Phone: [phone] Fax:  [phone]

## 2024-04-08 DIAGNOSIS — Z0000 Encounter for general adult medical examination without abnormal findings: Secondary | ICD-10-CM | POA: Diagnosis not present

## 2024-04-08 DIAGNOSIS — E7800 Pure hypercholesterolemia, unspecified: Secondary | ICD-10-CM | POA: Diagnosis not present

## 2024-04-08 DIAGNOSIS — I10 Essential (primary) hypertension: Secondary | ICD-10-CM | POA: Diagnosis not present

## 2024-04-11 DIAGNOSIS — Z1331 Encounter for screening for depression: Secondary | ICD-10-CM | POA: Diagnosis not present

## 2024-04-11 DIAGNOSIS — K219 Gastro-esophageal reflux disease without esophagitis: Secondary | ICD-10-CM | POA: Diagnosis not present

## 2024-04-11 DIAGNOSIS — Z21 Asymptomatic human immunodeficiency virus [HIV] infection status: Secondary | ICD-10-CM | POA: Diagnosis not present

## 2024-04-11 DIAGNOSIS — E7800 Pure hypercholesterolemia, unspecified: Secondary | ICD-10-CM | POA: Diagnosis not present

## 2024-04-11 DIAGNOSIS — I10 Essential (primary) hypertension: Secondary | ICD-10-CM | POA: Diagnosis not present

## 2024-07-16 ENCOUNTER — Other Ambulatory Visit: Payer: Self-pay

## 2024-07-16 ENCOUNTER — Emergency Department

## 2024-07-16 ENCOUNTER — Emergency Department
Admission: EM | Admit: 2024-07-16 | Discharge: 2024-07-16 | Disposition: A | Discharge status: Home / Self Care | Source: Ambulatory Visit | Attending: Emergency Medicine | Admitting: Emergency Medicine

## 2024-07-16 DIAGNOSIS — K529 Noninfective gastroenteritis and colitis, unspecified: Secondary | ICD-10-CM | POA: Diagnosis not present

## 2024-07-16 DIAGNOSIS — A039 Shigellosis, unspecified: Secondary | ICD-10-CM | POA: Diagnosis not present

## 2024-07-16 DIAGNOSIS — R1030 Lower abdominal pain, unspecified: Secondary | ICD-10-CM | POA: Diagnosis not present

## 2024-07-16 DIAGNOSIS — A09 Infectious gastroenteritis and colitis, unspecified: Secondary | ICD-10-CM | POA: Diagnosis not present

## 2024-07-16 DIAGNOSIS — R1010 Upper abdominal pain, unspecified: Secondary | ICD-10-CM | POA: Diagnosis present

## 2024-07-16 LAB — GASTROINTESTINAL PANEL BY PCR, STOOL (REPLACES STOOL CULTURE)

## 2024-07-16 LAB — URINALYSIS, ROUTINE W REFLEX MICROSCOPIC
Bilirubin Urine: NEGATIVE
Glucose, UA: NEGATIVE mg/dL
Hgb urine dipstick: NEGATIVE
Ketones, ur: NEGATIVE mg/dL
Leukocytes,Ua: NEGATIVE
Nitrite: NEGATIVE
Protein, ur: NEGATIVE mg/dL
Specific Gravity, Urine: 1.014 (ref 1.005–1.030)
pH: 6 (ref 5.0–8.0)

## 2024-07-16 LAB — COMPREHENSIVE METABOLIC PANEL WITH GFR
ALT: 20 U/L (ref 0–44)
AST: 20 U/L (ref 15–41)
Albumin: 3 g/dL — ABNORMAL LOW (ref 3.5–5.0)
Alkaline Phosphatase: 59 U/L (ref 38–126)
Anion gap: 9 (ref 5–15)
BUN: 13 mg/dL (ref 8–23)
CO2: 25 mmol/L (ref 22–32)
Calcium: 8.4 mg/dL — ABNORMAL LOW (ref 8.9–10.3)
Chloride: 101 mmol/L (ref 98–111)
Creatinine, Ser: 1.16 mg/dL (ref 0.61–1.24)
GFR, Estimated: >60 mL/min (ref >60)
Glucose, Bld: 121 mg/dL — ABNORMAL HIGH (ref 70–99)
Potassium: 3.6 mmol/L (ref 3.5–5.1)
Sodium: 135 mmol/L (ref 135–145)
Total Bilirubin: 0.7 mg/dL (ref 0.0–1.2)
Total Protein: 6.4 g/dL — ABNORMAL LOW (ref 6.5–8.1)

## 2024-07-16 LAB — CBC
HCT: 38.9 % — ABNORMAL LOW (ref 39.0–52.0)
Hemoglobin: 13.9 g/dL (ref 13.0–17.0)
MCH: 32.4 pg (ref 26.0–34.0)
MCHC: 35.7 g/dL (ref 30.0–36.0)
MCV: 90.7 fL (ref 80.0–100.0)
Platelets: 224 K/uL (ref 150–400)
RBC: 4.29 MIL/uL (ref 4.22–5.81)
RDW: 12.4 % (ref 11.5–15.5)
WBC: 14 K/uL — ABNORMAL HIGH (ref 4.0–10.5)
nRBC: 0 % (ref 0.0–0.2)

## 2024-07-16 LAB — C DIFFICILE QUICK SCREEN W PCR REFLEX
C Diff antigen: NEGATIVE
C Diff interpretation: NOT DETECTED
C Diff toxin: NEGATIVE

## 2024-07-16 LAB — LIPASE, BLOOD: Lipase: 49 U/L (ref 11–51)

## 2024-07-16 MED ORDER — METRONIDAZOLE 500 MG PO TABS: 500.0000 mg | ORAL_TABLET | Freq: Three times a day (TID) | ORAL | 0 refills | Status: AC

## 2024-07-16 MED ORDER — CIPROFLOXACIN HCL 500 MG PO TABS: 500.0000 mg | ORAL_TABLET | Freq: Two times a day (BID) | ORAL | 0 refills | Status: AC

## 2024-07-16 MED ORDER — SODIUM CHLORIDE 0.9 % IV BOLUS
1000.0000 mL | Freq: Once | INTRAVENOUS | Status: AC
  Administered 2024-07-16: 1000 mL via INTRAVENOUS

## 2024-07-16 MED ORDER — MORPHINE SULFATE (PF) 4 MG/ML IV SOLN
4.0000 mg | Freq: Once | INTRAVENOUS | Status: AC
  Administered 2024-07-16: 4 mg via INTRAVENOUS
  Filled 2024-07-16: qty 1

## 2024-07-16 MED ORDER — IOHEXOL 300 MG/ML  SOLN
100.0000 mL | Freq: Once | INTRAMUSCULAR | Status: AC | PRN
  Administered 2024-07-16: 100 mL via INTRAVENOUS

## 2024-07-16 NOTE — Discharge Instructions (Addendum)
 You were seen in the ER today for evaluation of your diarrhea.  Your testing showed that this is likely related to an infection caused by Shigella leading to inflammation of your colon known as colitis.  I sent a prescription for 2 antibiotics to your pharmacy.  Follow-up with your primary care doctor for further evaluation.  Please also discuss if they recommend a colonoscopy for further evaluation once your symptoms have resolved.  Return to the ER for new or worsening symptoms.

## 2024-07-16 NOTE — ED Provider Notes (Signed)
 [facility] Provider Note    Event Date/Time   First MD Initiated Contact with Patient 07/16/24 1135     (approximate)   History   Abdominal Pain   HPI  George Casey is a 63 year old male presenting to the emergency department for evaluation of abdominal pain.  About a week ago, patient noticed onset of multiple episodes of nonbloody diarrhea with some upper abdominal pain.  More recently he has developed worsening lower abdominal pain  bilaterally.  History of appendectomy.  No nausea or vomiting.  Reports frequent nonbloody diarrhea, greater than 10 episodes a day.    Physical Exam   Triage Vital Signs: ED Triage Vitals  Encounter Vitals Group     BP 07/16/24 1116 111/76     Girls Systolic BP Percentile --      Girls Diastolic BP Percentile --      Boys Systolic BP Percentile --      Boys Diastolic BP Percentile --      Pulse Rate 07/16/24 1116 87     Resp 07/16/24 1116 18     Temp 07/16/24 1116 97.6 F (36.4 C)     Temp Source 07/16/24 1116 Oral     SpO2 07/16/24 1116 100 %     Weight --      Height --      Head Circumference --      Peak Flow --      Pain Score 07/16/24 1117 2     Pain Loc --      Pain Education --      Exclude from Growth Chart --     Most recent vital signs: Vitals:   07/16/24 1116  BP: 111/76  Pulse: 87  Resp: 18  Temp: 97.6 F (36.4 C)  SpO2: 100%     General: Awake, interactive  CV:  Regular rate, good peripheral perfusion.  Resp:  Unlabored respirations.  Abd:  Nondistended, soft, tender to palpation throughout the lower abdomen without rebound or guarding, upper abdomen nontender Neuro:  Symmetric facial movement, fluid speech   ED Results / Procedures / Treatments   Labs (all labs ordered are listed, but only abnormal results are displayed) Labs Reviewed  GASTROINTESTINAL PANEL BY PCR, STOOL (REPLACES STOOL CULTURE) - Abnormal; Notable for the following components:      Result Value    Shigella/Enteroinvasive E coli (EIEC) DETECTED (*)    All other components within normal limits  COMPREHENSIVE METABOLIC PANEL WITH GFR - Abnormal; Notable for the following components:   Glucose, Bld 121 (*)    Calcium  8.4 (*)    Total Protein 6.4 (*)    Albumin 3.0 (*)    All other components within normal limits  CBC - Abnormal; Notable for the following components:   WBC 14.0 (*)    HCT 38.9 (*)    All other components within normal limits  URINALYSIS, ROUTINE W REFLEX MICROSCOPIC - Abnormal; Notable for the following components:   Color, Urine YELLOW (*)    APPearance CLEAR (*)    All other components within normal limits  C DIFFICILE QUICK SCREEN W PCR REFLEX    LIPASE, BLOOD     EKG EKG independently reviewed and interpreted by myself demonstrates:    RADIOLOGY Imaging independently reviewed and interpreted by myself demonstrates:  CT abdomen pelvis demonstrates area of colitis, likely infectious with clinical history  Formal Radiology Read:  CT ABDOMEN PELVIS W CONTRAST Result Date: 07/16/2024 CLINICAL DATA:  One-week  history of lower abdominal pain and diarrhea EXAM: CT ABDOMEN AND PELVIS WITH CONTRAST TECHNIQUE: Multidetector CT imaging of the abdomen and pelvis was performed using the standard protocol following bolus administration of intravenous contrast. RADIATION DOSE REDUCTION: This exam was performed according to the departmental dose-optimization program which includes automated exposure control, adjustment of the mA and/or kV according to patient size and/or use of iterative reconstruction technique. CONTRAST:  OMNIPAQUE  IOHEXOL  300 MG/ML  SOLN COMPARISON:  CT abdomen and pelvis dated 05/05/2010 FINDINGS: Lower chest: No focal consolidation or pulmonary nodule in the lung bases. No pleural effusion or pneumothorax demonstrated. Partially imaged heart size is normal. Hepatobiliary: No focal hepatic lesions. No intra or extrahepatic biliary ductal dilation. Normal  gallbladder. Pancreas: No focal lesions or main ductal dilation. Spleen: Normal in size without focal abnormality. Adrenals/Urinary Tract: No adrenal nodules. No suspicious renal mass, calculi or hydronephrosis. No focal bladder wall thickening. Stomach/Bowel: Normal appearance of the stomach. Long segment mural thickening of the underdistended descending and rectosigmoid colon with engorgement of the associated vasa recta. Appendectomy. Vascular/Lymphatic: Aortic atherosclerosis. No enlarged abdominal or pelvic lymph nodes. Reproductive: Prostate is unremarkable. Other: No free fluid, fluid collection, or free air. Musculoskeletal: No acute or abnormal lytic or blastic osseous lesions. Multilevel degenerative changes of the partially imaged thoracic and lumbar spine. IMPRESSION: 1. Long segment mural thickening of the underdistended descending and rectosigmoid colon with engorgement of the associated vasa recta, consistent with colitis, likely infectious or inflammatory. Consider correlation with colonoscopy once acute symptoms have resolved to exclude underlying mass lesion in this area if one has not been performed recently. 2.  Aortic Atherosclerosis (ICD10-I70.0). Electronically Signed   By: Limin  Xu M.D.   On: 07/16/2024 14:11    PROCEDURES:  Critical Care performed: No  Procedures   MEDICATIONS ORDERED IN ED: Medications  sodium chloride  0.9 % bolus 1,000 mL (0 mLs Intravenous Stopped 07/16/24 1420)  morphine  (PF) 4 MG/ML injection 4 mg (4 mg Intravenous Given 07/16/24 1320)  iohexol  (OMNIPAQUE ) 300 MG/ML solution 100 mL (100 mLs Intravenous Contrast Given 07/16/24 1347)     IMPRESSION / MDM / ASSESSMENT AND PLAN / ED COURSE  I reviewed the triage vital signs and the nursing notes.  Differential diagnosis includes, but is not limited to, colitis, diverticulitis, UTI, viral illness, C. difficile  Patient's presentation is most consistent with acute presentation with potential threat to life  or bodily function.  63 year old male presenting with diarrhea and abdominal pain.  Stable vitals on presentation.  Labs with leukocytosis WC of 14.  Normal hemoglobin.  CMP without critical derangements.  Normal lipase.  Will send stool studies patient able to provide sample.  CT pending.  CT demonstrated findings of colitis.  Patient was able provide stool sample which was negative for C. difficile, but did return positive for Shigella which I suspect is likely cause of patient's colitis noted on exam.  Discussed with pharmacist who reports that we do not follow resistance patterns for stool samples, but does feel that Cipro  and Flagyl  would be reasonable empiric antibiotics.  Patient without allergies.  I did consider and discuss admission given his significant diarrhea with associated colitis.  He strongly prefers discharge home which I do think is reasonable.  He does not meet SIRS criteria.  He was offered a dose of IV antibiotics here, but prefers to just take pills at home.  Will DC with prescriptions for Cipro  and Flagyl .  Strict return precautions provided.  Patient discharged in  stable condition.    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   Levander Slate, MD 07/16/24 [phone]

## 2024-07-16 NOTE — ED Triage Notes (Addendum)
 Pt to ED via POV from home. Pt reports bilateral lower abd pain and diarrhea x1 wk. Pain 2/10. Pt reports has had appendix removed - unsure of gallbladder. Pt reports pain increases with cough.

## 2024-07-16 NOTE — ED Notes (Signed)
 Patient transported to CT

## 2024-07-18 ENCOUNTER — Other Ambulatory Visit: Payer: Self-pay | Admitting: Medical Genetics

## 2024-08-23 LAB — MISCELLANEOUS TEST

## 2024-10-28 ENCOUNTER — Other Ambulatory Visit
Admission: RE | Admit: 2024-10-28 | Discharge: 2024-10-28 | Disposition: A | Discharge status: Home / Self Care | Payer: Self-pay | Source: Ambulatory Visit | Attending: Medical Genetics | Admitting: Medical Genetics

## 2024-11-15 LAB — GENECONNECT MOLECULAR SCREEN: Genetic Analysis Overall Interpretation: NEGATIVE
